# Patient Record
Sex: Male | Born: 1985 | Race: White | Hispanic: No | Marital: Single | Smoking: Former smoker
Health system: Southern US, Community
[De-identification: ages and names within clinical notes are randomized; demographics above are authoritative.]

## PROBLEM LIST (undated history)

## (undated) DIAGNOSIS — S2239XA Fracture of one rib, unspecified side, initial encounter for closed fracture: Secondary | ICD-10-CM

## (undated) DIAGNOSIS — F32A Depression, unspecified: Secondary | ICD-10-CM

## (undated) DIAGNOSIS — F4310 Post-traumatic stress disorder, unspecified: Secondary | ICD-10-CM

## (undated) DIAGNOSIS — F329 Major depressive disorder, single episode, unspecified: Secondary | ICD-10-CM

## (undated) DIAGNOSIS — J939 Pneumothorax, unspecified: Secondary | ICD-10-CM

## (undated) DIAGNOSIS — F419 Anxiety disorder, unspecified: Secondary | ICD-10-CM

## (undated) DIAGNOSIS — S36039A Unspecified laceration of spleen, initial encounter: Secondary | ICD-10-CM

## (undated) HISTORY — PX: OTHER SURGICAL HISTORY: SHX169

## (undated) HISTORY — DX: Unspecified laceration of spleen, initial encounter: S36.039A

## (undated) HISTORY — PX: ANTERIOR CRUCIATE LIGAMENT REPAIR: SHX115

## (undated) HISTORY — DX: Fracture of one rib, unspecified side, initial encounter for closed fracture: S22.39XA

---

## 2014-06-26 ENCOUNTER — Inpatient Hospital Stay (HOSPITAL_COMMUNITY)
Admission: EM | Admit: 2014-06-26 | Discharge: 2014-07-01 | DRG: 963 | Disposition: A | Discharge status: Home / Self Care | Payer: No Typology Code available for payment source | Attending: Surgery | Admitting: Surgery

## 2014-06-26 ENCOUNTER — Emergency Department (HOSPITAL_COMMUNITY): Payer: No Typology Code available for payment source

## 2014-06-26 ENCOUNTER — Encounter (HOSPITAL_COMMUNITY): Payer: Self-pay | Admitting: Radiology

## 2014-06-26 DIAGNOSIS — D62 Acute posthemorrhagic anemia: Secondary | ICD-10-CM | POA: Diagnosis not present

## 2014-06-26 DIAGNOSIS — F17200 Nicotine dependence, unspecified, uncomplicated: Secondary | ICD-10-CM | POA: Diagnosis present

## 2014-06-26 DIAGNOSIS — Z880 Allergy status to penicillin: Secondary | ICD-10-CM

## 2014-06-26 DIAGNOSIS — Y92410 Unspecified street and highway as the place of occurrence of the external cause: Secondary | ICD-10-CM | POA: Diagnosis not present

## 2014-06-26 DIAGNOSIS — S2249XA Multiple fractures of ribs, unspecified side, initial encounter for closed fracture: Secondary | ICD-10-CM | POA: Diagnosis present

## 2014-06-26 DIAGNOSIS — J9600 Acute respiratory failure, unspecified whether with hypoxia or hypercapnia: Secondary | ICD-10-CM | POA: Diagnosis present

## 2014-06-26 DIAGNOSIS — S36039A Unspecified laceration of spleen, initial encounter: Secondary | ICD-10-CM

## 2014-06-26 DIAGNOSIS — S0003XA Contusion of scalp, initial encounter: Secondary | ICD-10-CM | POA: Diagnosis present

## 2014-06-26 DIAGNOSIS — S27329A Contusion of lung, unspecified, initial encounter: Secondary | ICD-10-CM | POA: Diagnosis present

## 2014-06-26 DIAGNOSIS — S37819A Unspecified injury of adrenal gland, initial encounter: Secondary | ICD-10-CM | POA: Diagnosis present

## 2014-06-26 DIAGNOSIS — S060X9A Concussion with loss of consciousness of unspecified duration, initial encounter: Secondary | ICD-10-CM | POA: Diagnosis present

## 2014-06-26 DIAGNOSIS — S1093XA Contusion of unspecified part of neck, initial encounter: Secondary | ICD-10-CM | POA: Diagnosis present

## 2014-06-26 DIAGNOSIS — S270XXA Traumatic pneumothorax, initial encounter: Secondary | ICD-10-CM

## 2014-06-26 DIAGNOSIS — D735 Infarction of spleen: Secondary | ICD-10-CM | POA: Diagnosis present

## 2014-06-26 DIAGNOSIS — IMO0002 Reserved for concepts with insufficient information to code with codable children: Secondary | ICD-10-CM | POA: Diagnosis not present

## 2014-06-26 DIAGNOSIS — S025XXA Fracture of tooth (traumatic), initial encounter for closed fracture: Secondary | ICD-10-CM | POA: Diagnosis present

## 2014-06-26 DIAGNOSIS — T07XXXA Unspecified multiple injuries, initial encounter: Secondary | ICD-10-CM

## 2014-06-26 DIAGNOSIS — S032XXA Dislocation of tooth, initial encounter: Secondary | ICD-10-CM

## 2014-06-26 DIAGNOSIS — Z881 Allergy status to other antibiotic agents status: Secondary | ICD-10-CM | POA: Diagnosis not present

## 2014-06-26 DIAGNOSIS — J95821 Acute postprocedural respiratory failure: Secondary | ICD-10-CM

## 2014-06-26 DIAGNOSIS — S2241XA Multiple fractures of ribs, right side, initial encounter for closed fracture: Secondary | ICD-10-CM

## 2014-06-26 DIAGNOSIS — S060XAA Concussion with loss of consciousness status unknown, initial encounter: Secondary | ICD-10-CM

## 2014-06-26 DIAGNOSIS — S0083XA Contusion of other part of head, initial encounter: Secondary | ICD-10-CM | POA: Diagnosis present

## 2014-06-26 LAB — COMPREHENSIVE METABOLIC PANEL
ALT: 387 U/L — ABNORMAL HIGH (ref 0–53)
ANION GAP: 20 — AB (ref 5–15)
AST: 496 U/L — ABNORMAL HIGH (ref 0–37)
Albumin: 4.5 g/dL (ref 3.5–5.2)
Alkaline Phosphatase: 63 U/L (ref 39–117)
BUN: 15 mg/dL (ref 6–23)
CO2: 21 mEq/L (ref 19–32)
Calcium: 9.3 mg/dL (ref 8.4–10.5)
Chloride: 100 mEq/L (ref 96–112)
Creatinine, Ser: 1.18 mg/dL (ref 0.50–1.35)
GFR calc non Af Amer: 83 mL/min — ABNORMAL LOW (ref >90)
GLUCOSE: 172 mg/dL — AB (ref 70–99)
Potassium: 3.3 mEq/L — ABNORMAL LOW (ref 3.7–5.3)
SODIUM: 141 meq/L (ref 137–147)
TOTAL PROTEIN: 7.4 g/dL (ref 6.0–8.3)
Total Bilirubin: 0.7 mg/dL (ref 0.3–1.2)

## 2014-06-26 LAB — CBG MONITORING, ED: GLUCOSE-CAPILLARY: 172 mg/dL — AB (ref 70–99)

## 2014-06-26 LAB — I-STAT CHEM 8, ED
BUN: 17 mg/dL (ref 6–23)
CHLORIDE: 103 meq/L (ref 96–112)
Calcium, Ion: 1.17 mmol/L (ref 1.12–1.23)
Creatinine, Ser: 1.3 mg/dL (ref 0.50–1.35)
Glucose, Bld: 178 mg/dL — ABNORMAL HIGH (ref 70–99)
HEMATOCRIT: 50 % (ref 39.0–52.0)
HEMOGLOBIN: 17 g/dL (ref 13.0–17.0)
Potassium: 3.1 mEq/L — ABNORMAL LOW (ref 3.7–5.3)
SODIUM: 142 meq/L (ref 137–147)
TCO2: 22 mmol/L (ref 0–100)

## 2014-06-26 LAB — URINALYSIS, ROUTINE W REFLEX MICROSCOPIC
Bilirubin Urine: NEGATIVE
GLUCOSE, UA: NEGATIVE mg/dL
Ketones, ur: NEGATIVE mg/dL
Nitrite: NEGATIVE
PROTEIN: 100 mg/dL — AB
Specific Gravity, Urine: 1.026 (ref 1.005–1.030)
Urobilinogen, UA: 0.2 mg/dL (ref 0.0–1.0)
pH: 8 (ref 5.0–8.0)

## 2014-06-26 LAB — I-STAT ARTERIAL BLOOD GAS, ED
ACID-BASE DEFICIT: 2 mmol/L (ref 0.0–2.0)
Bicarbonate: 21.9 mEq/L (ref 20.0–24.0)
O2 Saturation: 100 %
TCO2: 23 mmol/L (ref 0–100)
pCO2 arterial: 35.3 mmHg (ref 35.0–45.0)
pH, Arterial: 7.4 (ref 7.350–7.450)
pO2, Arterial: 185 mmHg — ABNORMAL HIGH (ref 80.0–100.0)

## 2014-06-26 LAB — CBC
HCT: 46.7 % (ref 39.0–52.0)
Hemoglobin: 17 g/dL (ref 13.0–17.0)
MCH: 31.4 pg (ref 26.0–34.0)
MCHC: 36.4 g/dL — ABNORMAL HIGH (ref 30.0–36.0)
MCV: 86.2 fL (ref 78.0–100.0)
Platelets: 239 10*3/uL (ref 150–400)
RBC: 5.42 MIL/uL (ref 4.22–5.81)
RDW: 12.5 % (ref 11.5–15.5)
WBC: 22.7 10*3/uL — ABNORMAL HIGH (ref 4.0–10.5)

## 2014-06-26 LAB — URINE MICROSCOPIC-ADD ON

## 2014-06-26 LAB — PROTIME-INR
INR: 1.09 (ref 0.00–1.49)
PROTHROMBIN TIME: 14.1 s (ref 11.6–15.2)

## 2014-06-26 LAB — CDS SEROLOGY

## 2014-06-26 LAB — MRSA PCR SCREENING: MRSA by PCR: NEGATIVE

## 2014-06-26 LAB — ABO/RH: ABO/RH(D): B NEG

## 2014-06-26 LAB — ETHANOL

## 2014-06-26 MED ORDER — PROPOFOL 10 MG/ML IV EMUL
5.0000 ug/kg/min | INTRAVENOUS | Status: DC
  Administered 2014-06-26: 60 ug/kg/min via INTRAVENOUS
  Administered 2014-06-27: 50 ug/kg/min via INTRAVENOUS
  Administered 2014-06-27 (×2): 60 ug/kg/min via INTRAVENOUS
  Filled 2014-06-26 (×4): qty 100

## 2014-06-26 MED ORDER — CETYLPYRIDINIUM CHLORIDE 0.05 % MT LIQD
7.0000 mL | Freq: Four times a day (QID) | OROMUCOSAL | Status: DC
  Administered 2014-06-26 – 2014-06-27 (×3): 7 mL via OROMUCOSAL

## 2014-06-26 MED ORDER — MORPHINE SULFATE 2 MG/ML IJ SOLN
INTRAMUSCULAR | Status: AC
  Administered 2014-06-26: 4 mg via INTRAVENOUS
  Filled 2014-06-26: qty 2

## 2014-06-26 MED ORDER — LIDOCAINE HCL (CARDIAC) 20 MG/ML IV SOLN
INTRAVENOUS | Status: AC
Start: 2014-06-26 — End: 2014-06-27
  Filled 2014-06-26: qty 5

## 2014-06-26 MED ORDER — IOHEXOL 300 MG/ML  SOLN
100.0000 mL | Freq: Once | INTRAMUSCULAR | Status: AC | PRN
  Administered 2014-06-26: 100 mL via INTRAVENOUS

## 2014-06-26 MED ORDER — CHLORHEXIDINE GLUCONATE 0.12 % MT SOLN
15.0000 mL | Freq: Two times a day (BID) | OROMUCOSAL | Status: DC
  Administered 2014-06-27 (×3): 15 mL via OROMUCOSAL
  Filled 2014-06-26 (×3): qty 15

## 2014-06-26 MED ORDER — ONDANSETRON HCL 4 MG/2ML IJ SOLN
4.0000 mg | Freq: Four times a day (QID) | INTRAMUSCULAR | Status: DC | PRN
  Filled 2014-06-26: qty 2

## 2014-06-26 MED ORDER — BACITRACIN-NEOMYCIN-POLYMYXIN OINTMENT TUBE
TOPICAL_OINTMENT | Freq: Every day | CUTANEOUS | Status: DC
  Administered 2014-06-27: 11:00:00 via TOPICAL
  Filled 2014-06-26: qty 15

## 2014-06-26 MED ORDER — SODIUM CHLORIDE 0.9 % IV BOLUS (SEPSIS)
1000.0000 mL | Freq: Once | INTRAVENOUS | Status: AC
  Administered 2014-06-26: 1000 mL via INTRAVENOUS

## 2014-06-26 MED ORDER — ONDANSETRON HCL 4 MG PO TABS: 4.0000 mg | ORAL_TABLET | Freq: Four times a day (QID) | ORAL | Status: DC | PRN

## 2014-06-26 MED ORDER — PROPOFOL 10 MG/ML IV EMUL
INTRAVENOUS | Status: AC
  Administered 2014-06-26: 20:00:00
  Administered 2014-06-26 (×2): 50 ug/kg/min
  Administered 2014-06-26: 50 mg/h
  Administered 2014-06-26: 20:00:00
  Filled 2014-06-26: qty 100

## 2014-06-26 MED ORDER — HYDROMORPHONE HCL PF 1 MG/ML IJ SOLN
1.0000 mg | INTRAMUSCULAR | Status: DC | PRN
  Administered 2014-06-27 – 2014-06-28 (×7): 1 mg via INTRAVENOUS
  Filled 2014-06-26 (×7): qty 1

## 2014-06-26 MED ORDER — PANTOPRAZOLE SODIUM 40 MG IV SOLR
40.0000 mg | Freq: Every day | INTRAVENOUS | Status: DC
  Administered 2014-06-27: 40 mg via INTRAVENOUS
  Filled 2014-06-26 (×2): qty 40

## 2014-06-26 MED ORDER — PROPOFOL 10 MG/ML IV EMUL
5.0000 ug/kg/min | INTRAVENOUS | Status: DC
  Administered 2014-06-26 (×2): 70 ug/kg/min via INTRAVENOUS

## 2014-06-26 MED ORDER — PANTOPRAZOLE SODIUM 40 MG PO TBEC: 40.0000 mg | DELAYED_RELEASE_TABLET | Freq: Every day | ORAL | Status: DC

## 2014-06-26 MED ORDER — ROCURONIUM BROMIDE 50 MG/5ML IV SOLN
INTRAVENOUS | Status: AC
  Filled 2014-06-26: qty 2

## 2014-06-26 MED ORDER — ETOMIDATE 2 MG/ML IV SOLN
INTRAVENOUS | Status: AC
  Administered 2014-06-26: 120 mg
  Filled 2014-06-26: qty 20

## 2014-06-26 MED ORDER — DEXTROSE-NACL 5-0.9 % IV SOLN
INTRAVENOUS | Status: DC
  Administered 2014-06-26 – 2014-06-27 (×2): via INTRAVENOUS

## 2014-06-26 MED ORDER — SUCCINYLCHOLINE CHLORIDE 20 MG/ML IJ SOLN
INTRAMUSCULAR | Status: AC
  Administered 2014-06-26: 120 mg
  Filled 2014-06-26: qty 1

## 2014-06-26 MED ORDER — PROPOFOL 10 MG/ML IV EMUL
INTRAVENOUS | Status: AC
  Filled 2014-06-26: qty 100
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
## 2014-06-26 NOTE — ED Notes (Signed)
[facility] [id number]
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
## 2014-06-26 NOTE — ED Notes (Signed)
Family updated as to patient's status.

## 2014-06-27 ENCOUNTER — Inpatient Hospital Stay (HOSPITAL_COMMUNITY): Payer: No Typology Code available for payment source

## 2014-06-27 DIAGNOSIS — J9600 Acute respiratory failure, unspecified whether with hypoxia or hypercapnia: Secondary | ICD-10-CM | POA: Diagnosis present

## 2014-06-27 DIAGNOSIS — S270XXA Traumatic pneumothorax, initial encounter: Secondary | ICD-10-CM

## 2014-06-27 DIAGNOSIS — S2241XA Multiple fractures of ribs, right side, initial encounter for closed fracture: Secondary | ICD-10-CM

## 2014-06-27 DIAGNOSIS — S032XXA Dislocation of tooth, initial encounter: Secondary | ICD-10-CM

## 2014-06-27 DIAGNOSIS — S060X9A Concussion with loss of consciousness of unspecified duration, initial encounter: Secondary | ICD-10-CM

## 2014-06-27 DIAGNOSIS — S272XXA Traumatic hemopneumothorax, initial encounter: Secondary | ICD-10-CM

## 2014-06-27 DIAGNOSIS — S060XAA Concussion with loss of consciousness status unknown, initial encounter: Secondary | ICD-10-CM

## 2014-06-27 LAB — CBC
HEMATOCRIT: 40.7 % (ref 39.0–52.0)
HEMOGLOBIN: 14.5 g/dL (ref 13.0–17.0)
MCH: 31.1 pg (ref 26.0–34.0)
MCHC: 35.6 g/dL (ref 30.0–36.0)
MCV: 87.3 fL (ref 78.0–100.0)
Platelets: 167 10*3/uL (ref 150–400)
RBC: 4.66 MIL/uL (ref 4.22–5.81)
RDW: 12.7 % (ref 11.5–15.5)
WBC: 19.6 10*3/uL — AB (ref 4.0–10.5)

## 2014-06-27 LAB — TYPE AND SCREEN
ABO/RH(D): B NEG
Antibody Screen: NEGATIVE
Unit division: 0
Unit division: 0

## 2014-06-27 LAB — BLOOD GAS, ARTERIAL
Acid-base deficit: 1.1 mmol/L (ref 0.0–2.0)
Bicarbonate: 22.9 mEq/L (ref 20.0–24.0)
Drawn by: 249101
FIO2: 0.4 %
LHR: 16 {breaths}/min
O2 Saturation: 97.8 %
PATIENT TEMPERATURE: 98.6
PEEP: 5 cmH2O
PH ART: 7.41 (ref 7.350–7.450)
TCO2: 24 mmol/L (ref 0–100)
VT: 600 mL
pCO2 arterial: 36.9 mmHg (ref 35.0–45.0)
pO2, Arterial: 90.7 mmHg (ref 80.0–100.0)

## 2014-06-27 LAB — COMPREHENSIVE METABOLIC PANEL
ALBUMIN: 3.5 g/dL (ref 3.5–5.2)
ALK PHOS: 47 U/L (ref 39–117)
ALT: 295 U/L — AB (ref 0–53)
AST: 410 U/L — ABNORMAL HIGH (ref 0–37)
Anion gap: 13 (ref 5–15)
BILIRUBIN TOTAL: 0.8 mg/dL (ref 0.3–1.2)
BUN: 15 mg/dL (ref 6–23)
CHLORIDE: 107 meq/L (ref 96–112)
CO2: 21 mEq/L (ref 19–32)
Calcium: 8.2 mg/dL — ABNORMAL LOW (ref 8.4–10.5)
Creatinine, Ser: 0.99 mg/dL (ref 0.50–1.35)
GFR calc Af Amer: >90 mL/min (ref >90)
GFR calc non Af Amer: >90 mL/min (ref >90)
Glucose, Bld: 140 mg/dL — ABNORMAL HIGH (ref 70–99)
POTASSIUM: 3.1 meq/L — AB (ref 3.7–5.3)
SODIUM: 141 meq/L (ref 137–147)
TOTAL PROTEIN: 5.7 g/dL — AB (ref 6.0–8.3)

## 2014-06-27 LAB — PREPARE FRESH FROZEN PLASMA
UNIT DIVISION: 0
Unit division: 0

## 2014-06-27 LAB — HEMOGLOBIN AND HEMATOCRIT, BLOOD
HCT: 39.6 % (ref 39.0–52.0)
Hemoglobin: 14 g/dL (ref 13.0–17.0)

## 2014-06-27 MED ORDER — LIDOCAINE HCL (PF) 1 % IJ SOLN
INTRAMUSCULAR | Status: AC
  Filled 2014-06-27: qty 5

## 2014-06-27 MED ORDER — SODIUM CHLORIDE 0.9 % IV SOLN
INTRAVENOUS | Status: DC
  Administered 2014-06-27: 10:00:00 via INTRAVENOUS

## 2014-06-27 MED ORDER — MIDAZOLAM HCL 2 MG/2ML IJ SOLN: 5.0000 mg | Freq: Once | INTRAMUSCULAR | Status: DC

## 2014-06-27 MED ORDER — FENTANYL CITRATE 0.05 MG/ML IJ SOLN
100.0000 ug | Freq: Once | INTRAMUSCULAR | Status: DC
  Filled 2014-06-27: qty 2

## 2014-06-27 MED ORDER — POTASSIUM CHLORIDE 20 MEQ/15ML (10%) PO LIQD
30.0000 meq | ORAL | Status: AC
  Administered 2014-06-27 (×2): 30 meq
  Filled 2014-06-27 (×3): qty 30

## 2014-06-27 MED ORDER — FENTANYL CITRATE 0.05 MG/ML IJ SOLN
INTRAMUSCULAR | Status: AC
  Administered 2014-06-27: 100 ug
  Filled 2014-06-27: qty 4

## 2014-06-27 MED ORDER — MIDAZOLAM HCL 2 MG/2ML IJ SOLN
INTRAMUSCULAR | Status: AC
  Administered 2014-06-27: 5 mg
  Filled 2014-06-27: qty 6

## 2014-06-27 MED ORDER — LIDOCAINE HCL (PF) 1 % IJ SOLN
INTRAMUSCULAR | Status: AC
  Administered 2014-06-27: 11:00:00
  Filled 2014-06-27: qty 5

## 2014-06-27 NOTE — Progress Notes (Signed)
Paged Dr. Wilson regardingAndrey Campanile cervical xray results.  Dr. Andrey CampanileWilson cleared patient to remove C-collar. Shadd Dunstan C

## 2014-06-27 NOTE — Procedures (Signed)
Chest Tube Insertion Procedure Note  Indications:  Clinically significant Right Pneumothorax  Pre-operative Diagnosis: Right Pneumothorax  Post-operative Diagnosis: Right Pneumothorax  Procedure Details  Informed consent was obtained for the procedure, including sedation.  Risks of lung perforation, hemorrhage, arrhythmia, and adverse drug reaction were discussed.   He was sedated with propofol, versed, and fentanyl.  After sterile skin prep, using standard technique, a 28 French tube was placed in the right anterior lateral 5th-6th rib space.  Findings:  Rush of air released, pneumothorax confirmed  Estimated Blood Loss:  Minimal         Specimens:  None              Complications:  None; patient tolerated the procedure well.         Disposition: ICU - intubated and hemodynamically stable.         Condition: stable

## 2014-06-27 NOTE — Progress Notes (Signed)
Patient was combative and bit down on tube dislodging upper left central incisor. Paul Porter C
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
## 2014-06-27 NOTE — Progress Notes (Signed)
06/27/2014- Resp care note- Pt suctioned and extubated to 4lpm cannula at 1510.  RN and physician assistant at bedside for procedure.  Pt agitated prior to tube removal.  Once tube removed, pt relaxed and breathing more comfortably.  Vital signs post extubation- hr104, rr26, bp 129/86, sats 97% on 4lpm cannula.  Pt vocalizing post extubation.  Ventilator and ambu-bag at bedside.  Will monitor progress and wean as tolerated.  s Deepak Bless rrt, rcp
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
## 2014-06-28 ENCOUNTER — Inpatient Hospital Stay (HOSPITAL_COMMUNITY): Payer: No Typology Code available for payment source

## 2014-06-28 DIAGNOSIS — D62 Acute posthemorrhagic anemia: Secondary | ICD-10-CM

## 2014-06-28 LAB — CBC
HCT: 34.3 % — ABNORMAL LOW (ref 39.0–52.0)
Hemoglobin: 12.3 g/dL — ABNORMAL LOW (ref 13.0–17.0)
MCH: 30.8 pg (ref 26.0–34.0)
MCHC: 35.9 g/dL (ref 30.0–36.0)
MCV: 86 fL (ref 78.0–100.0)
PLATELETS: 86 10*3/uL — AB (ref 150–400)
RBC: 3.99 MIL/uL — AB (ref 4.22–5.81)
RDW: 12.4 % (ref 11.5–15.5)
WBC: 11.6 10*3/uL — AB (ref 4.0–10.5)

## 2014-06-28 LAB — CBC WITH DIFFERENTIAL/PLATELET
BASOS PCT: 0 % (ref 0–1)
Basophils Absolute: 0 10*3/uL (ref 0.0–0.1)
EOS PCT: 1 % (ref 0–5)
Eosinophils Absolute: 0.1 10*3/uL (ref 0.0–0.7)
HCT: 32.2 % — ABNORMAL LOW (ref 39.0–52.0)
Hemoglobin: 11.6 g/dL — ABNORMAL LOW (ref 13.0–17.0)
LYMPHS PCT: 18 % (ref 12–46)
Lymphs Abs: 2.2 10*3/uL (ref 0.7–4.0)
MCH: 31.2 pg (ref 26.0–34.0)
MCHC: 36 g/dL (ref 30.0–36.0)
MCV: 86.6 fL (ref 78.0–100.0)
Monocytes Absolute: 1.1 10*3/uL — ABNORMAL HIGH (ref 0.1–1.0)
Monocytes Relative: 9 % (ref 3–12)
NEUTROS ABS: 9.2 10*3/uL — AB (ref 1.7–7.7)
Neutrophils Relative %: 73 % (ref 43–77)
PLATELETS: 101 10*3/uL — AB (ref 150–400)
RBC: 3.72 MIL/uL — ABNORMAL LOW (ref 4.22–5.81)
RDW: 12.6 % (ref 11.5–15.5)
WBC: 12.6 10*3/uL — AB (ref 4.0–10.5)

## 2014-06-28 LAB — BASIC METABOLIC PANEL
ANION GAP: 10 (ref 5–15)
BUN: 8 mg/dL (ref 6–23)
CALCIUM: 7.7 mg/dL — AB (ref 8.4–10.5)
CO2: 24 mEq/L (ref 19–32)
CREATININE: 0.81 mg/dL (ref 0.50–1.35)
Chloride: 103 mEq/L (ref 96–112)
GFR calc Af Amer: >90 mL/min (ref >90)
Glucose, Bld: 100 mg/dL — ABNORMAL HIGH (ref 70–99)
Potassium: 3.5 mEq/L — ABNORMAL LOW (ref 3.7–5.3)
SODIUM: 137 meq/L (ref 137–147)

## 2014-06-28 LAB — TRIGLYCERIDES: Triglycerides: 172 mg/dL — ABNORMAL HIGH

## 2014-06-28 MED ORDER — BACITRACIN-NEOMYCIN-POLYMYXIN OINTMENT TUBE
TOPICAL_OINTMENT | Freq: Two times a day (BID) | CUTANEOUS | Status: DC
  Administered 2014-06-28: 3 via TOPICAL
  Administered 2014-06-28 – 2014-07-01 (×6): via TOPICAL
  Filled 2014-06-28: qty 15

## 2014-06-28 MED ORDER — DOCUSATE SODIUM 100 MG PO CAPS
100.0000 mg | ORAL_CAPSULE | Freq: Two times a day (BID) | ORAL | Status: DC
  Administered 2014-06-28 – 2014-07-01 (×7): 100 mg via ORAL
  Filled 2014-06-28 (×8): qty 1

## 2014-06-28 MED ORDER — POLYETHYLENE GLYCOL 3350 17 G PO PACK
17.0000 g | PACK | Freq: Every day | ORAL | Status: DC
  Administered 2014-07-01 (×2): 17 g via ORAL
  Filled 2014-06-28 (×5): qty 1

## 2014-06-28 MED ORDER — HYDROMORPHONE HCL PF 1 MG/ML IJ SOLN
0.5000 mg | INTRAMUSCULAR | Status: DC | PRN
  Administered 2014-06-30 – 2014-07-01 (×3): 0.5 mg via INTRAVENOUS
  Filled 2014-06-28 (×4): qty 1

## 2014-06-28 MED ORDER — OXYCODONE HCL 5 MG PO TABS
5.0000 mg | ORAL_TABLET | ORAL | Status: DC | PRN
  Administered 2014-06-28 (×2): 10 mg via ORAL
  Administered 2014-06-29 (×5): 5 mg via ORAL
  Administered 2014-06-30: 10 mg via ORAL
  Administered 2014-06-30: 15 mg via ORAL
  Administered 2014-06-30: 5 mg via ORAL
  Administered 2014-06-30 – 2014-07-01 (×2): 10 mg via ORAL
  Administered 2014-07-01: 15 mg via ORAL
  Filled 2014-06-28 (×2): qty 2
  Filled 2014-06-28: qty 3
  Filled 2014-06-28: qty 2
  Filled 2014-06-28: qty 3
  Filled 2014-06-28 (×2): qty 1
  Filled 2014-06-28: qty 2
  Filled 2014-06-28 (×4): qty 1
  Filled 2014-06-28: qty 3

## 2014-06-28 MED ORDER — DIPHENHYDRAMINE HCL 25 MG PO CAPS
25.0000 mg | ORAL_CAPSULE | Freq: Four times a day (QID) | ORAL | Status: DC | PRN
  Administered 2014-06-28 – 2014-07-01 (×11): 25 mg via ORAL
  Filled 2014-06-28 (×11): qty 1

## 2014-06-28 NOTE — Procedures (Signed)
I was present for the entire procedure and the patient did well without complications.  This patient has been seen and I agree with the findings and treatment plan.  Marta LamasJames O. Gae BonWyatt, III, MD, FACS [phone] (pager) [phone] (direct pager) Trauma Surgeon
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
## 2014-06-28 NOTE — Plan of Care (Signed)
Problem: SLP Cognition Goals Goal: Patient will demonstrate attention to functional Patient will demonstrate attention to functional task with Selective attention

## 2014-06-29 ENCOUNTER — Inpatient Hospital Stay (HOSPITAL_COMMUNITY): Payer: No Typology Code available for payment source

## 2014-06-29 LAB — CBC
HEMATOCRIT: 34.6 % — AB (ref 39.0–52.0)
HEMOGLOBIN: 12.3 g/dL — AB (ref 13.0–17.0)
MCH: 30.2 pg (ref 26.0–34.0)
MCHC: 35.5 g/dL (ref 30.0–36.0)
MCV: 85 fL (ref 78.0–100.0)
Platelets: 90 10*3/uL — ABNORMAL LOW (ref 150–400)
RBC: 4.07 MIL/uL — ABNORMAL LOW (ref 4.22–5.81)
RDW: 12.4 % (ref 11.5–15.5)
WBC: 12.2 10*3/uL — AB (ref 4.0–10.5)

## 2014-06-29 LAB — BLOOD PRODUCT ORDER (VERBAL) VERIFICATION
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
## 2014-06-29 NOTE — Progress Notes (Signed)
Patient is doing well.  Hemoglobin is stable, platelets are increasing.  Keep at bedrest. But can be transferred to the floor.  This patient has been seen and I agree with the findings and treatment plan.  Marta LamasJames O. Gae BonWyatt, III, MD, FACS [phone] (pager) [phone] (direct pager) Trauma Surgeon

## 2014-06-30 ENCOUNTER — Inpatient Hospital Stay (HOSPITAL_COMMUNITY): Payer: No Typology Code available for payment source

## 2014-06-30 LAB — CBC
HEMATOCRIT: 38.6 % — AB (ref 39.0–52.0)
HEMOGLOBIN: 13.7 g/dL (ref 13.0–17.0)
MCH: 30.5 pg (ref 26.0–34.0)
MCHC: 35.5 g/dL (ref 30.0–36.0)
MCV: 86 fL (ref 78.0–100.0)
PLATELETS: 101 10*3/uL — AB (ref 150–400)
RBC: 4.49 MIL/uL (ref 4.22–5.81)
RDW: 12.4 % (ref 11.5–15.5)
WBC: 10.1 10*3/uL (ref 4.0–10.5)

## 2014-06-30 NOTE — Progress Notes (Signed)
SLP Cancellation Note  Patient Details Name: Paul Porter MRN: [id number] DOB: 02/19/1986   Cancelled treatment:       Reason Eval/Treat Not Completed: Patient at procedure or test/unavailable (pt not in room upon SLP arrival). Will return as able for cognitive tx.    Paul Porter, M.A. CCC-SLP [phone]  Paul Hamaiewonsky, Paul Porter 06/30/2014, 2:56 PM

## 2014-06-30 NOTE — Progress Notes (Signed)
UR completed.  Linell Meldrum, RN BSN MHA CCM Trauma/Neuro ICU Case Manager [phone]  

## 2014-06-30 NOTE — Progress Notes (Signed)
Doing well and should be able to go home tomorrow.  This patient has been seen and I agree with the findings and treatment plan.  Kentavius Dettore O. Giulian Goldring, III, MD, FACS [phone] (pager) [phone] (direct pager) Trauma Surgeon 
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
## 2014-06-30 NOTE — Progress Notes (Signed)
Patient ID: Paul Porter, male   DOB: 08/12/1986, 28 y.o.   MRN: [id number]   LOS: 4 days   Subjective: No new c/o.   Objective: Vital signs in last 24 hours: Temp:  [97.8 F (36.6 C)-99.5 F (37.5 C)] 97.8 F (36.6 C) (08/14 0510) Pulse Rate:  [74-85] 83 (08/14 0510) Resp:  [18-19] 19 (08/14 0510) BP: (122-138)/(70-90) 138/84 mmHg (08/14 0510) SpO2:  [96 %-99 %] 98 % (08/14 0510) Last BM Date: 06/26/14   Laboratory  CBC  Recent Labs  06/29/14 0404 06/30/14 0504  WBC 12.2* 10.1  HGB 12.3* 13.7  HCT 34.6* 38.6*  PLT 90* 101*    Radiology Results PORTABLE CHEST - 1 VIEW  COMPARISON: 06/29/2014  FINDINGS:  Previously seen right chest tube has been removed. No pneumothorax  visualized. The heart size and mediastinal contours are within  normal limits. Mild atelectasis or scarring again seen in the left  lung base. Both lungs are otherwise clear. The visualized skeletal  structures are unremarkable.  IMPRESSION:  No active disease. No pneumothorax identified status post right  chest tube removal.  Electronically Signed  By: Myles RosenthalJohn Stahl M.D.  On: 06/30/2014 07:39   Physical Exam General appearance: alert and no distress Resp: clear to auscultation bilaterally Cardio: regular rate and rhythm GI: normal findings: bowel sounds normal and soft, non-tender   Assessment/Plan: [facility]  Concussion -- ST eval  Tooth avulsion  Multiple right rib fxs w/PTX s/p CT  Grade 3 splenic lac -- Ambulate today ABL anemia -- Stabilized  FEN -- No issues VTE -- SCD's  Dispo -- PT/OT consults, maybe home tomorrow if hgb stable and PT clears.    Freeman CaldronMichael J. Latoia Eyster, PA-C Pager: [phone] General Trauma PA Pager: [phone]  06/30/2014

## 2014-07-01 LAB — CBC
HEMATOCRIT: 36.5 % — AB (ref 39.0–52.0)
HEMOGLOBIN: 12.9 g/dL — AB (ref 13.0–17.0)
MCH: 29.9 pg (ref 26.0–34.0)
MCHC: 35.3 g/dL (ref 30.0–36.0)
MCV: 84.5 fL (ref 78.0–100.0)
Platelets: 127 10*3/uL — ABNORMAL LOW (ref 150–400)
RBC: 4.32 MIL/uL (ref 4.22–5.81)
RDW: 12.4 % (ref 11.5–15.5)
WBC: 10.2 10*3/uL (ref 4.0–10.5)

## 2014-07-01 MED ORDER — OXYCODONE HCL 5 MG PO TABS: 5.0000 mg | ORAL_TABLET | Freq: Four times a day (QID) | ORAL | Status: DC | PRN

## 2014-07-01 NOTE — Discharge Planning (Signed)
Copy of AVS and rx to pt who verbalizes understanding.  MD discussed wound care with pt.  Pt showered and dressings reapplied. Dc'd per w/c to private car home with all personal belongings, acomp. By family.
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
## 2014-07-01 NOTE — Discharge Instructions (Signed)
No running, jumping, ball or contact sports, bikes, skateboards, motorcycles for 3 months.  No driving while taking oxycodone or hydrocodone.  Wash wounds daily in shower with soap and water. Do not soak. Apply antibiotic ointment (e.g. Neosporin) twice daily and as needed to keep moist. Cover with dry dressing.  [facility] - DISCHARGE INSTRUCTIONS TO PATIENT  Activity:  Driving - No driving while taking oxycodone.   Lifting - Take it easy for 7 days, but then no limit  Wound Care:   No running, jumping, ball or contact sports, bikes, skateboards, motorcycles for 3 months.      Wash wounds daily in shower with soap and water.      Do not soak.      Apply antibiotic ointment (e.g. Neosporin) twice daily and as needed to keep moist.  Diet:  Eat light until normal bowel function returns.  Follow up appointment:  Call [facility] at [phone] for an appointment in 1 to 2 weeks in the Trauma Clinic  Medications and dosages:  Resume your home medications.  You have a prescription for:  Oxycodone.             I would avoid NSAID's (i.e., aspirin, advil) until seen in the trauma clinic  Call Dr. Lindie SpruceWyatt (Trauma Surgeon) or his office  [phone]) if you have:  Temperature greater than 100.4,  Persistent nausea and vomiting,  Severe uncontrolled pain,  Redness, tenderness, or signs of infection (pain, swelling, redness, odor or green/yellow discharge around the site),  Difficulty breathing, headache or visual disturbances,  Any other questions or concerns you may have after discharge.  In an emergency, call 911 or go to an Emergency Department at a nearby hospital.
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
## 2014-07-03 ENCOUNTER — Telehealth (HOSPITAL_COMMUNITY): Payer: Self-pay
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
## 2014-07-04 NOTE — Telephone Encounter (Signed)
Scheduled appointment for pt on 8/26 for suture removal.

## 2014-07-12 ENCOUNTER — Encounter (INDEPENDENT_AMBULATORY_CARE_PROVIDER_SITE_OTHER): Payer: Self-pay

## 2014-07-12 ENCOUNTER — Encounter (INDEPENDENT_AMBULATORY_CARE_PROVIDER_SITE_OTHER): Payer: Self-pay | Admitting: *Deleted

## 2014-07-12 ENCOUNTER — Ambulatory Visit (INDEPENDENT_AMBULATORY_CARE_PROVIDER_SITE_OTHER): Payer: Self-pay | Admitting: General Surgery

## 2014-07-12 DIAGNOSIS — S069XAS Unspecified intracranial injury with loss of consciousness status unknown, sequela: Secondary | ICD-10-CM

## 2014-07-12 DIAGNOSIS — S025XXA Fracture of tooth (traumatic), initial encounter for closed fracture: Secondary | ICD-10-CM

## 2014-07-12 DIAGNOSIS — M25519 Pain in unspecified shoulder: Secondary | ICD-10-CM

## 2014-07-12 DIAGNOSIS — Z5189 Encounter for other specified aftercare: Secondary | ICD-10-CM

## 2014-07-12 DIAGNOSIS — S069X9S Unspecified intracranial injury with loss of consciousness of unspecified duration, sequela: Secondary | ICD-10-CM

## 2014-07-12 DIAGNOSIS — IMO0001 Reserved for inherently not codable concepts without codable children: Secondary | ICD-10-CM

## 2014-07-12 DIAGNOSIS — S36039D Unspecified laceration of spleen, subsequent encounter: Secondary | ICD-10-CM

## 2014-07-12 DIAGNOSIS — S37812D Contusion of adrenal gland, subsequent encounter: Secondary | ICD-10-CM

## 2014-07-12 DIAGNOSIS — S3609XA Other injury of spleen, initial encounter: Secondary | ICD-10-CM

## 2014-07-12 DIAGNOSIS — S060X9S Concussion with loss of consciousness of unspecified duration, sequela: Secondary | ICD-10-CM

## 2014-07-12 DIAGNOSIS — S2241XD Multiple fractures of ribs, right side, subsequent encounter for fracture with routine healing: Secondary | ICD-10-CM

## 2014-07-12 DIAGNOSIS — M25511 Pain in right shoulder: Secondary | ICD-10-CM

## 2014-07-12 DIAGNOSIS — S032XXD Dislocation of tooth, subsequent encounter: Secondary | ICD-10-CM

## 2014-07-12 MED ORDER — IBUPROFEN 200 MG PO TABS: 800.0000 mg | ORAL_TABLET | Freq: Three times a day (TID) | ORAL | Status: AC | PRN

## 2014-07-12 MED ORDER — METHOCARBAMOL 750 MG PO TABS: 750.0000 mg | ORAL_TABLET | Freq: Three times a day (TID) | ORAL | Status: DC | PRN

## 2014-07-12 MED ORDER — OXYCODONE HCL 5 MG PO TABS: 5.0000 mg | ORAL_TABLET | Freq: Three times a day (TID) | ORAL | Status: DC | PRN

## 2014-07-12 NOTE — Patient Instructions (Addendum)
1.  Start Motrin  as needed for pain/inflammation up to 3 times a day.  Take with food/snack. 2.  Start robaxin (muscle relaxer) 3.  Continue oxycodone, but start to wean down off of them.  Use them sparingly for severe pain. 4.  Start gentle range of motion and stretching exercises of your upper and lower extremities.  Gradual return to activity.  Ribs will take 6-8 weeks to fully heal. 5.  Do not smoke marijuana with your pain prescriptions 6.  Ice areas of pain 7.  Use scar cream, vit E, or vaseline to help minimize road rash scarring 8.  Can return to work on light duty/desk work on 07/17/14 or no restrictions in 2-4 weeks
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
## 2014-08-09 ENCOUNTER — Encounter (INDEPENDENT_AMBULATORY_CARE_PROVIDER_SITE_OTHER): Payer: Self-pay

## 2014-08-10 ENCOUNTER — Other Ambulatory Visit (INDEPENDENT_AMBULATORY_CARE_PROVIDER_SITE_OTHER): Payer: Self-pay | Admitting: *Deleted

## 2014-08-10 DIAGNOSIS — K5732 Diverticulitis of large intestine without perforation or abscess without bleeding: Secondary | ICD-10-CM

## 2014-09-01 ENCOUNTER — Other Ambulatory Visit: Payer: Self-pay | Admitting: Family Medicine

## 2014-09-01 DIAGNOSIS — M25512 Pain in left shoulder: Secondary | ICD-10-CM

## 2014-09-05 ENCOUNTER — Other Ambulatory Visit: Payer: Self-pay | Admitting: Family Medicine

## 2014-09-05 DIAGNOSIS — Z139 Encounter for screening, unspecified: Secondary | ICD-10-CM

## 2014-09-08 ENCOUNTER — Ambulatory Visit (INDEPENDENT_AMBULATORY_CARE_PROVIDER_SITE_OTHER): Payer: Self-pay | Admitting: Neurology

## 2014-09-08 ENCOUNTER — Encounter: Payer: Self-pay | Admitting: Neurology

## 2014-09-08 VITALS — BP 112/86 | HR 68 | Resp 20 | Ht 67.25 in | Wt 180.0 lb

## 2014-09-08 DIAGNOSIS — F329 Major depressive disorder, single episode, unspecified: Secondary | ICD-10-CM

## 2014-09-08 DIAGNOSIS — F0781 Postconcussional syndrome: Secondary | ICD-10-CM

## 2014-09-08 DIAGNOSIS — R413 Other amnesia: Secondary | ICD-10-CM

## 2014-09-08 DIAGNOSIS — F32A Depression, unspecified: Secondary | ICD-10-CM

## 2014-09-08 MED ORDER — ESCITALOPRAM OXALATE 5 MG PO TABS: 5.0000 mg | ORAL_TABLET | Freq: Every day | ORAL | Status: DC

## 2014-09-08 NOTE — Patient Instructions (Addendum)
1. Schedule MRI brain without contrast 2. Start Lexapro 5mg  once a day 3. Follow-up in 2 months
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
## 2014-09-09 ENCOUNTER — Encounter: Payer: Self-pay | Admitting: Neurology

## 2014-09-15 ENCOUNTER — Ambulatory Visit
Admission: RE | Admit: 2014-09-15 | Discharge: 2014-09-15 | Disposition: A | Discharge status: Home / Self Care | Payer: No Typology Code available for payment source | Source: Ambulatory Visit | Attending: Family Medicine | Admitting: Family Medicine

## 2014-09-15 ENCOUNTER — Other Ambulatory Visit: Payer: Self-pay

## 2014-09-15 ENCOUNTER — Ambulatory Visit
Admission: RE | Admit: 2014-09-15 | Discharge: 2014-09-15 | Disposition: A | Discharge status: Home / Self Care | Payer: No Typology Code available for payment source | Source: Ambulatory Visit | Attending: Neurology | Admitting: Neurology

## 2014-09-15 DIAGNOSIS — M25512 Pain in left shoulder: Secondary | ICD-10-CM

## 2014-09-15 DIAGNOSIS — Z139 Encounter for screening, unspecified: Secondary | ICD-10-CM

## 2014-09-15 MED ORDER — IOHEXOL 180 MG/ML  SOLN
15.0000 mL | Freq: Once | INTRAMUSCULAR | Status: AC | PRN
  Administered 2014-09-15: 15 mL via INTRA_ARTICULAR

## 2014-09-18 ENCOUNTER — Other Ambulatory Visit: Payer: Self-pay | Admitting: Family Medicine

## 2014-09-18 ENCOUNTER — Telehealth: Payer: Self-pay | Admitting: Neurology

## 2014-09-18 DIAGNOSIS — M25561 Pain in right knee: Secondary | ICD-10-CM

## 2014-09-18 NOTE — Telephone Encounter (Signed)
Pt would like the results of the MRI [phone]

## 2014-09-18 NOTE — Telephone Encounter (Signed)
Pls see Results Note, thanks!

## 2014-09-18 NOTE — Telephone Encounter (Signed)
Have you had a chance to review his results? Please advise.

## 2014-09-20 ENCOUNTER — Ambulatory Visit
Admission: RE | Admit: 2014-09-20 | Discharge: 2014-09-20 | Disposition: A | Discharge status: Home / Self Care | Payer: Self-pay | Source: Ambulatory Visit | Attending: Family Medicine | Admitting: Family Medicine

## 2014-09-20 DIAGNOSIS — M25561 Pain in right knee: Secondary | ICD-10-CM

## 2014-11-08 ENCOUNTER — Telehealth: Payer: Self-pay | Admitting: Neurology

## 2014-11-08 ENCOUNTER — Ambulatory Visit: Payer: Self-pay | Admitting: Neurology

## 2014-11-08 NOTE — Telephone Encounter (Signed)
See previous documentation. Appt marked as a no show due to same day cancellation but a no show letter will not be sent. Pt has already rescheduled appt in January / Sherri S.

## 2014-11-08 NOTE — Telephone Encounter (Signed)
Pt called at 8:21AM to cancel his f/u appt for today 11/08/14.  Pt stated that he can not drive due to him having surgery on his leg a few days ago. I spoke w/ pt yesterday and confirmed with me. (I am not sure if he doesn't remember due to him being on meds.)

## 2014-11-08 NOTE — Telephone Encounter (Signed)
Noted  

## 2014-11-27 ENCOUNTER — Ambulatory Visit (INDEPENDENT_AMBULATORY_CARE_PROVIDER_SITE_OTHER): Payer: Self-pay | Admitting: Neurology

## 2014-11-27 ENCOUNTER — Encounter: Payer: Self-pay | Admitting: Neurology

## 2014-11-27 VITALS — BP 120/90 | HR 92 | Resp 18 | Ht 67.25 in

## 2014-11-27 DIAGNOSIS — F32A Depression, unspecified: Secondary | ICD-10-CM

## 2014-11-27 DIAGNOSIS — F329 Major depressive disorder, single episode, unspecified: Secondary | ICD-10-CM

## 2014-11-27 DIAGNOSIS — F0781 Postconcussional syndrome: Secondary | ICD-10-CM

## 2014-11-27 DIAGNOSIS — R413 Other amnesia: Secondary | ICD-10-CM
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
## 2014-11-27 NOTE — Patient Instructions (Signed)
1. Proceed with psychotherapy as scheduled 2. Continue with Lexapro 3. Call our office for any changes in your symptoms

## 2014-11-30 ENCOUNTER — Encounter: Payer: Self-pay | Admitting: Neurology

## 2018-01-03 ENCOUNTER — Emergency Department (HOSPITAL_COMMUNITY)
Admission: EM | Admit: 2018-01-03 | Discharge: 2018-01-03 | Disposition: A | Discharge status: Home / Self Care | Payer: Non-veteran care | Attending: Emergency Medicine | Admitting: Emergency Medicine

## 2018-01-03 ENCOUNTER — Other Ambulatory Visit: Payer: Self-pay

## 2018-01-03 ENCOUNTER — Encounter (HOSPITAL_COMMUNITY): Payer: Self-pay

## 2018-01-03 DIAGNOSIS — Y9389 Activity, other specified: Secondary | ICD-10-CM | POA: Diagnosis not present

## 2018-01-03 DIAGNOSIS — X58XXXA Exposure to other specified factors, initial encounter: Secondary | ICD-10-CM | POA: Diagnosis not present

## 2018-01-03 DIAGNOSIS — T1592XA Foreign body on external eye, part unspecified, left eye, initial encounter: Secondary | ICD-10-CM

## 2018-01-03 DIAGNOSIS — Y998 Other external cause status: Secondary | ICD-10-CM | POA: Diagnosis not present

## 2018-01-03 DIAGNOSIS — Y929 Unspecified place or not applicable: Secondary | ICD-10-CM | POA: Diagnosis not present

## 2018-01-03 DIAGNOSIS — T1502XA Foreign body in cornea, left eye, initial encounter: Secondary | ICD-10-CM | POA: Diagnosis present

## 2018-01-03 DIAGNOSIS — Z79899 Other long term (current) drug therapy: Secondary | ICD-10-CM | POA: Insufficient documentation

## 2018-01-03 DIAGNOSIS — F17200 Nicotine dependence, unspecified, uncomplicated: Secondary | ICD-10-CM | POA: Diagnosis not present

## 2018-01-03 DIAGNOSIS — Z23 Encounter for immunization: Secondary | ICD-10-CM | POA: Insufficient documentation

## 2018-01-03 MED ORDER — TETRACAINE HCL 0.5 % OP SOLN
2.0000 [drp] | Freq: Once | OPHTHALMIC | Status: AC
  Administered 2018-01-03: 2 [drp] via OPHTHALMIC
  Filled 2018-01-03: qty 4

## 2018-01-03 MED ORDER — POLYMYXIN B-TRIMETHOPRIM 10000-0.1 UNIT/ML-% OP SOLN: 2.0000 [drp] | OPHTHALMIC | 0 refills | Status: AC

## 2018-01-03 MED ORDER — FLUORESCEIN SODIUM 1 MG OP STRP
1.0000 | ORAL_STRIP | Freq: Once | OPHTHALMIC | Status: AC
  Administered 2018-01-03: 1 via OPHTHALMIC
  Filled 2018-01-03: qty 1

## 2018-01-03 MED ORDER — TETANUS-DIPHTH-ACELL PERTUSSIS 5-2.5-18.5 LF-MCG/0.5 IM SUSP
0.5000 mL | Freq: Once | INTRAMUSCULAR | Status: AC
  Administered 2018-01-03: 0.5 mL via INTRAMUSCULAR
  Filled 2018-01-03: qty 0.5
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
## 2018-01-03 NOTE — ED Notes (Signed)
Bed: WTR9 Expected date:  Expected time:  Means of arrival:  Comments: 

## 2018-01-03 NOTE — Discharge Instructions (Signed)
Use the antibiotic, as prescribed, for 7 days. Go directly to the ophthalmologist office following discharge from the ED.

## 2018-05-24 ENCOUNTER — Encounter (HOSPITAL_COMMUNITY): Payer: Self-pay

## 2018-05-24 ENCOUNTER — Emergency Department (HOSPITAL_COMMUNITY)
Admission: EM | Admit: 2018-05-24 | Discharge: 2018-05-24 | Disposition: A | Discharge status: Home / Self Care | Payer: Non-veteran care | Attending: Emergency Medicine | Admitting: Emergency Medicine

## 2018-05-24 ENCOUNTER — Other Ambulatory Visit: Payer: Self-pay

## 2018-05-24 DIAGNOSIS — Z87891 Personal history of nicotine dependence: Secondary | ICD-10-CM | POA: Insufficient documentation

## 2018-05-24 DIAGNOSIS — K2900 Acute gastritis without bleeding: Secondary | ICD-10-CM | POA: Insufficient documentation

## 2018-05-24 DIAGNOSIS — R112 Nausea with vomiting, unspecified: Secondary | ICD-10-CM

## 2018-05-24 HISTORY — DX: Post-traumatic stress disorder, unspecified: F43.10

## 2018-05-24 HISTORY — DX: Pneumothorax, unspecified: J93.9

## 2018-05-24 HISTORY — DX: Depression, unspecified: F32.A

## 2018-05-24 HISTORY — DX: Major depressive disorder, single episode, unspecified: F32.9

## 2018-05-24 HISTORY — DX: Anxiety disorder, unspecified: F41.9

## 2018-05-24 LAB — CBC WITH DIFFERENTIAL/PLATELET
Basophils Absolute: 0 10*3/uL (ref 0.0–0.1)
Basophils Relative: 0 %
Eosinophils Absolute: 0.1 10*3/uL (ref 0.0–0.7)
Eosinophils Relative: 1 %
HEMATOCRIT: 48.8 % (ref 39.0–52.0)
Hemoglobin: 17.6 g/dL — ABNORMAL HIGH (ref 13.0–17.0)
LYMPHS PCT: 17 %
Lymphs Abs: 1.6 10*3/uL (ref 0.7–4.0)
MCH: 30.8 pg (ref 26.0–34.0)
MCHC: 36.1 g/dL — AB (ref 30.0–36.0)
MCV: 85.5 fL (ref 78.0–100.0)
MONOS PCT: 9 %
Monocytes Absolute: 0.8 10*3/uL (ref 0.1–1.0)
NEUTROS ABS: 7.2 10*3/uL (ref 1.7–7.7)
Neutrophils Relative %: 73 %
Platelets: 231 10*3/uL (ref 150–400)
RBC: 5.71 MIL/uL (ref 4.22–5.81)
RDW: 12.5 % (ref 11.5–15.5)
WBC: 9.7 10*3/uL (ref 4.0–10.5)

## 2018-05-24 LAB — HEPATIC FUNCTION PANEL
ALT: 14 U/L (ref 0–44)
AST: 17 U/L (ref 15–41)
Albumin: 4.9 g/dL (ref 3.5–5.0)
Alkaline Phosphatase: 55 U/L (ref 38–126)
Bilirubin, Direct: 0.3 mg/dL — ABNORMAL HIGH (ref 0.0–0.2)
Indirect Bilirubin: 1.6 mg/dL — ABNORMAL HIGH (ref 0.3–0.9)
Total Bilirubin: 1.9 mg/dL — ABNORMAL HIGH (ref 0.3–1.2)
Total Protein: 7.9 g/dL (ref 6.5–8.1)

## 2018-05-24 LAB — BASIC METABOLIC PANEL
ANION GAP: 11 (ref 5–15)
BUN: 17 mg/dL (ref 6–20)
CO2: 30 mmol/L (ref 22–32)
Calcium: 9.8 mg/dL (ref 8.9–10.3)
Chloride: 102 mmol/L (ref 98–111)
Creatinine, Ser: 1.19 mg/dL (ref 0.61–1.24)
GFR calc Af Amer: >60 mL/min (ref >60)
GFR calc non Af Amer: >60 mL/min (ref >60)
Glucose, Bld: 122 mg/dL — ABNORMAL HIGH (ref 70–99)
POTASSIUM: 3.8 mmol/L (ref 3.5–5.1)
Sodium: 143 mmol/L (ref 135–145)

## 2018-05-24 LAB — LIPASE, BLOOD: Lipase: 49 U/L (ref 11–51)

## 2018-05-24 MED ORDER — FAMOTIDINE IN NACL 20-0.9 MG/50ML-% IV SOLN
20.0000 mg | Freq: Once | INTRAVENOUS | Status: AC
  Administered 2018-05-24: 20 mg via INTRAVENOUS
  Filled 2018-05-24: qty 50

## 2018-05-24 MED ORDER — LORAZEPAM 2 MG/ML IJ SOLN
1.0000 mg | Freq: Once | INTRAMUSCULAR | Status: AC
  Administered 2018-05-24: 1 mg via INTRAVENOUS
  Filled 2018-05-24: qty 1

## 2018-05-24 MED ORDER — SODIUM CHLORIDE 0.9 % IV BOLUS
1000.0000 mL | Freq: Once | INTRAVENOUS | Status: AC
  Administered 2018-05-24: 1000 mL via INTRAVENOUS

## 2018-05-24 MED ORDER — ONDANSETRON HCL 4 MG/2ML IJ SOLN
4.0000 mg | Freq: Once | INTRAMUSCULAR | Status: AC
  Administered 2018-05-24: 4 mg via INTRAVENOUS
  Filled 2018-05-24: qty 2

## 2018-05-24 MED ORDER — FAMOTIDINE 20 MG PO TABS: 20.0000 mg | ORAL_TABLET | Freq: Two times a day (BID) | ORAL | 0 refills | Status: DC

## 2018-05-24 MED ORDER — ONDANSETRON 4 MG PO TBDP: ORAL_TABLET | ORAL | 0 refills | Status: AC
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
## 2018-05-24 NOTE — ED Notes (Signed)
Pt has been notified that urine collection is needed  

## 2018-05-24 NOTE — ED Notes (Signed)
Pt given gingerale for PO fluid challenge and encouraged to drink
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
## 2018-05-26 ENCOUNTER — Other Ambulatory Visit: Payer: Self-pay

## 2018-05-26 ENCOUNTER — Emergency Department (HOSPITAL_COMMUNITY)
Admission: EM | Admit: 2018-05-26 | Discharge: 2018-05-26 | Disposition: A | Discharge status: Home / Self Care | Payer: Non-veteran care | Attending: Emergency Medicine | Admitting: Emergency Medicine

## 2018-05-26 ENCOUNTER — Emergency Department (HOSPITAL_COMMUNITY): Payer: Non-veteran care

## 2018-05-26 DIAGNOSIS — R112 Nausea with vomiting, unspecified: Secondary | ICD-10-CM | POA: Insufficient documentation

## 2018-05-26 DIAGNOSIS — F1210 Cannabis abuse, uncomplicated: Secondary | ICD-10-CM | POA: Insufficient documentation

## 2018-05-26 DIAGNOSIS — Z87891 Personal history of nicotine dependence: Secondary | ICD-10-CM | POA: Insufficient documentation

## 2018-05-26 DIAGNOSIS — R109 Unspecified abdominal pain: Secondary | ICD-10-CM | POA: Insufficient documentation

## 2018-05-26 LAB — COMPREHENSIVE METABOLIC PANEL
ALT: 16 U/L (ref 0–44)
AST: 22 U/L (ref 15–41)
Albumin: 4.6 g/dL (ref 3.5–5.0)
Alkaline Phosphatase: 48 U/L (ref 38–126)
Anion gap: 8 (ref 5–15)
BUN: 12 mg/dL (ref 6–20)
CO2: 28 mmol/L (ref 22–32)
Calcium: 9.2 mg/dL (ref 8.9–10.3)
Chloride: 106 mmol/L (ref 98–111)
Creatinine, Ser: 1 mg/dL (ref 0.61–1.24)
GFR calc Af Amer: >60 mL/min (ref >60)
GFR calc non Af Amer: >60 mL/min (ref >60)
Glucose, Bld: 115 mg/dL — ABNORMAL HIGH (ref 70–99)
POTASSIUM: 3.6 mmol/L (ref 3.5–5.1)
Sodium: 142 mmol/L (ref 135–145)
Total Bilirubin: 1.5 mg/dL — ABNORMAL HIGH (ref 0.3–1.2)
Total Protein: 7.2 g/dL (ref 6.5–8.1)

## 2018-05-26 LAB — URINALYSIS, ROUTINE W REFLEX MICROSCOPIC
BILIRUBIN URINE: NEGATIVE
Glucose, UA: NEGATIVE mg/dL
Hgb urine dipstick: NEGATIVE
Ketones, ur: 20 mg/dL — AB
Leukocytes, UA: NEGATIVE
NITRITE: NEGATIVE
Protein, ur: NEGATIVE mg/dL
SPECIFIC GRAVITY, URINE: 1.026 (ref 1.005–1.030)
pH: 5 (ref 5.0–8.0)

## 2018-05-26 LAB — CBC
HEMATOCRIT: 44.3 % (ref 39.0–52.0)
Hemoglobin: 15.9 g/dL (ref 13.0–17.0)
MCH: 30.5 pg (ref 26.0–34.0)
MCHC: 35.9 g/dL (ref 30.0–36.0)
MCV: 84.9 fL (ref 78.0–100.0)
Platelets: 233 10*3/uL (ref 150–400)
RBC: 5.22 MIL/uL (ref 4.22–5.81)
RDW: 12.6 % (ref 11.5–15.5)
WBC: 8 10*3/uL (ref 4.0–10.5)

## 2018-05-26 LAB — LIPASE, BLOOD: LIPASE: 41 U/L (ref 11–51)

## 2018-05-26 MED ORDER — ONDANSETRON HCL 4 MG/2ML IJ SOLN
4.0000 mg | Freq: Once | INTRAMUSCULAR | Status: AC
  Administered 2018-05-26: 4 mg via INTRAVENOUS
  Filled 2018-05-26: qty 2

## 2018-05-26 MED ORDER — SODIUM CHLORIDE 0.9 % IV BOLUS
1000.0000 mL | Freq: Once | INTRAVENOUS | Status: AC
  Administered 2018-05-26: 1000 mL via INTRAVENOUS

## 2018-05-26 MED ORDER — METOCLOPRAMIDE HCL 10 MG PO TABS: 10.0000 mg | ORAL_TABLET | Freq: Four times a day (QID) | ORAL | 0 refills | Status: AC
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
## 2018-05-26 NOTE — ED Notes (Signed)
Pt informed of need for urine.  Will try again in 30 minutes.

## 2018-05-26 NOTE — Discharge Instructions (Addendum)
I have given you information for one of the GI providers where you can follow-up with and get evaluated further if your nausea/vomiting gets worse.  I wish you best of luck with everything!

## 2018-05-26 NOTE — ED Notes (Signed)
Pt aware that we need urine  

## 2018-07-21 ENCOUNTER — Other Ambulatory Visit: Payer: Self-pay

## 2018-07-21 ENCOUNTER — Encounter (HOSPITAL_COMMUNITY): Payer: Self-pay

## 2018-07-21 ENCOUNTER — Emergency Department (HOSPITAL_COMMUNITY)
Admission: EM | Admit: 2018-07-21 | Discharge: 2018-07-21 | Disposition: A | Discharge status: Home / Self Care | Payer: Non-veteran care | Attending: Emergency Medicine | Admitting: Emergency Medicine

## 2018-07-21 DIAGNOSIS — R1013 Epigastric pain: Secondary | ICD-10-CM | POA: Insufficient documentation

## 2018-07-21 DIAGNOSIS — Z87891 Personal history of nicotine dependence: Secondary | ICD-10-CM | POA: Insufficient documentation

## 2018-07-21 DIAGNOSIS — R109 Unspecified abdominal pain: Secondary | ICD-10-CM

## 2018-07-21 DIAGNOSIS — F419 Anxiety disorder, unspecified: Secondary | ICD-10-CM | POA: Insufficient documentation

## 2018-07-21 DIAGNOSIS — F329 Major depressive disorder, single episode, unspecified: Secondary | ICD-10-CM | POA: Insufficient documentation

## 2018-07-21 DIAGNOSIS — R1012 Left upper quadrant pain: Secondary | ICD-10-CM | POA: Insufficient documentation

## 2018-07-21 DIAGNOSIS — Z79899 Other long term (current) drug therapy: Secondary | ICD-10-CM | POA: Insufficient documentation

## 2018-07-21 LAB — CBC
HCT: 48.5 % (ref 39.0–52.0)
Hemoglobin: 17.5 g/dL — ABNORMAL HIGH (ref 13.0–17.0)
MCH: 30.5 pg (ref 26.0–34.0)
MCHC: 36.1 g/dL — ABNORMAL HIGH (ref 30.0–36.0)
MCV: 84.5 fL (ref 78.0–100.0)
PLATELETS: 264 10*3/uL (ref 150–400)
RBC: 5.74 MIL/uL (ref 4.22–5.81)
RDW: 12.5 % (ref 11.5–15.5)
WBC: 9 10*3/uL (ref 4.0–10.5)

## 2018-07-21 LAB — URINALYSIS, ROUTINE W REFLEX MICROSCOPIC
Bilirubin Urine: NEGATIVE
GLUCOSE, UA: NEGATIVE mg/dL
HGB URINE DIPSTICK: NEGATIVE
Ketones, ur: 5 mg/dL — AB
Leukocytes, UA: NEGATIVE
Nitrite: NEGATIVE
Protein, ur: NEGATIVE mg/dL
SPECIFIC GRAVITY, URINE: 1.028 (ref 1.005–1.030)
pH: 5 (ref 5.0–8.0)

## 2018-07-21 LAB — COMPREHENSIVE METABOLIC PANEL
ALBUMIN: 4.9 g/dL (ref 3.5–5.0)
ALK PHOS: 59 U/L (ref 38–126)
ALT: 14 U/L (ref 0–44)
ANION GAP: 11 (ref 5–15)
AST: 18 U/L (ref 15–41)
BILIRUBIN TOTAL: 2.1 mg/dL — AB (ref 0.3–1.2)
BUN: 14 mg/dL (ref 6–20)
CALCIUM: 9.7 mg/dL (ref 8.9–10.3)
CO2: 31 mmol/L (ref 22–32)
Chloride: 103 mmol/L (ref 98–111)
Creatinine, Ser: 1.07 mg/dL (ref 0.61–1.24)
Glucose, Bld: 136 mg/dL — ABNORMAL HIGH (ref 70–99)
POTASSIUM: 3.8 mmol/L (ref 3.5–5.1)
Sodium: 145 mmol/L (ref 135–145)
Total Protein: 8.1 g/dL (ref 6.5–8.1)

## 2018-07-21 LAB — RAPID URINE DRUG SCREEN, HOSP PERFORMED
AMPHETAMINES: NOT DETECTED
BENZODIAZEPINES: NOT DETECTED
Barbiturates: NOT DETECTED
Cocaine: NOT DETECTED
Opiates: NOT DETECTED
Tetrahydrocannabinol: POSITIVE — AB

## 2018-07-21 LAB — LIPASE, BLOOD: Lipase: 55 U/L — ABNORMAL HIGH (ref 11–51)

## 2018-07-21 MED ORDER — HYDROXYZINE HCL 25 MG PO TABS: 25.0000 mg | ORAL_TABLET | Freq: Four times a day (QID) | ORAL | 0 refills | Status: AC

## 2018-07-21 MED ORDER — FAMOTIDINE 20 MG PO TABS: 20.0000 mg | ORAL_TABLET | Freq: Two times a day (BID) | ORAL | 0 refills | Status: AC

## 2018-07-21 MED ORDER — GI COCKTAIL ~~LOC~~
30.0000 mL | Freq: Once | ORAL | Status: AC
  Administered 2018-07-21: 30 mL via ORAL
  Filled 2018-07-21: qty 30

## 2018-07-21 MED ORDER — PROMETHAZINE HCL 25 MG PO TABS
12.5000 mg | ORAL_TABLET | Freq: Once | ORAL | Status: AC
  Administered 2018-07-21: 12.5 mg via ORAL
  Filled 2018-07-21: qty 1

## 2018-07-21 MED ORDER — FAMOTIDINE IN NACL 20-0.9 MG/50ML-% IV SOLN
20.0000 mg | Freq: Once | INTRAVENOUS | Status: AC
  Administered 2018-07-21: 20 mg via INTRAVENOUS
  Filled 2018-07-21: qty 50

## 2018-07-21 MED ORDER — SODIUM CHLORIDE 0.9 % IV BOLUS
1000.0000 mL | Freq: Once | INTRAVENOUS | Status: AC
  Administered 2018-07-21: 1000 mL via INTRAVENOUS

## 2018-07-21 MED ORDER — ONDANSETRON 4 MG PO TBDP
4.0000 mg | ORAL_TABLET | Freq: Once | ORAL | Status: AC | PRN
  Administered 2018-07-21: 4 mg via ORAL
  Filled 2018-07-21: qty 1

## 2018-07-21 MED ORDER — ONDANSETRON HCL 4 MG PO TABS: 4.0000 mg | ORAL_TABLET | Freq: Three times a day (TID) | ORAL | 0 refills | Status: AC | PRN

## 2018-07-21 MED ORDER — SUCRALFATE 1 G PO TABS
1.0000 g | ORAL_TABLET | Freq: Once | ORAL | Status: AC
  Administered 2018-07-21: 1 g via ORAL
  Filled 2018-07-21: qty 1
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
## 2018-07-21 NOTE — ED Notes (Signed)
Patient says he is trying to quit marijuana as his primary coping mechanism for his PTSD, but has limited resources available through the [place] at this time. He is actively asking for assistance and this RN let the PA Cortni know of his need for resources.
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
## 2018-07-21 NOTE — ED Notes (Signed)
Patient is tolerating PO fluids with no N/V

## 2018-07-21 NOTE — Discharge Instructions (Signed)
Please take the medications that you are prescribed as outlined in your discharge paperwork.  Please make sure to stay well-hydrated.  Please follow-up with [facility] as needed.  You are also given resources for outpatient counseling.  You are also given a referral to a gastroenterology doctor if he continued to have persistent symptoms.  Please follow-up with these resources as directed.  Please follow up with your primary doctor within the next 5-7 days.  If you do not have a primary care provider, information for a healthcare clinic has been provided for you to make arrangements for follow up care. Please return to the ER sooner if you have any new or worsening symptoms, or if you have any of the following symptoms:  Abdominal pain that does not go away.  You have a fever.  You keep throwing up (vomiting).  The pain is felt only in portions of the abdomen. Pain in the right side could possibly be appendicitis. In an adult, pain in the left lower portion of the abdomen could be colitis or diverticulitis.  You pass bloody or black tarry stools.  There is bright red blood in the stool.  The constipation stays for more than 4 days.  There is belly (abdominal) or rectal pain.  You do not seem to be getting better.  You have any questions or concerns.

## 2021-02-20 ENCOUNTER — Emergency Department (HOSPITAL_COMMUNITY)
Admission: EM | Admit: 2021-02-20 | Discharge: 2021-02-20 | Disposition: A | Discharge status: Home / Self Care | Payer: No Typology Code available for payment source | Attending: Emergency Medicine | Admitting: Emergency Medicine

## 2021-02-20 ENCOUNTER — Other Ambulatory Visit: Payer: Self-pay

## 2021-02-20 ENCOUNTER — Emergency Department (HOSPITAL_COMMUNITY): Payer: No Typology Code available for payment source

## 2021-02-20 ENCOUNTER — Encounter (HOSPITAL_COMMUNITY): Payer: Self-pay

## 2021-02-20 DIAGNOSIS — M5416 Radiculopathy, lumbar region: Secondary | ICD-10-CM | POA: Insufficient documentation

## 2021-02-20 DIAGNOSIS — Z87891 Personal history of nicotine dependence: Secondary | ICD-10-CM | POA: Insufficient documentation

## 2021-02-20 DIAGNOSIS — M545 Low back pain, unspecified: Secondary | ICD-10-CM | POA: Diagnosis present

## 2021-02-20 DIAGNOSIS — M5410 Radiculopathy, site unspecified: Secondary | ICD-10-CM

## 2021-02-20 DIAGNOSIS — M5440 Lumbago with sciatica, unspecified side: Secondary | ICD-10-CM

## 2021-02-20 MED ORDER — OXYCODONE-ACETAMINOPHEN 5-325 MG PO TABS: 2.0000 | ORAL_TABLET | Freq: Four times a day (QID) | ORAL | 0 refills | Status: AC | PRN

## 2021-02-20 MED ORDER — KETOROLAC TROMETHAMINE 60 MG/2ML IM SOLN
60.0000 mg | Freq: Once | INTRAMUSCULAR | Status: AC
  Administered 2021-02-20: 60 mg via INTRAMUSCULAR
  Filled 2021-02-20: qty 2

## 2021-02-20 MED ORDER — OXYCODONE-ACETAMINOPHEN 5-325 MG PO TABS
1.0000 | ORAL_TABLET | Freq: Four times a day (QID) | ORAL | Status: DC | PRN
  Administered 2021-02-20: 1 via ORAL
  Filled 2021-02-20: qty 1

## 2021-02-20 MED ORDER — CYCLOBENZAPRINE HCL 10 MG PO TABS: 10.0000 mg | ORAL_TABLET | Freq: Two times a day (BID) | ORAL | 0 refills | Status: AC | PRN

## 2021-02-20 NOTE — ED Provider Notes (Signed)
Patient evaluated at bedside by Dr. Johnsie Cancel.  We will discharge with continued use of Medrol Dosepak he was already prescribed.  Will prescribe patient a limited course of Percocet, Flexeril, Tylenol and ibuprofen recommendations given, neurosurgery consultation and follow-up arranged.  Dr. Johnsie Cancel recommend patient follow-up with him outpatient as needed.  Return precautions given.   Solon Augusta [place], [place] 02/20/21 2243    Maia Plan, MD 02/20/21 2358

## 2021-02-20 NOTE — ED Notes (Signed)
[facility] called at this time. Transportation en-route at this time.

## 2021-02-20 NOTE — ED Notes (Signed)
Pt was transferred by [facility] and came from [facility].
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
## 2021-02-20 NOTE — Discharge Instructions (Addendum)
Please keep taking her Medrol/steroid medication. Please follow-up with Dr. Maurice Small.  I prescribed you Flexeril which is a muscle relaxer.  I would like you to also use Tylenol and ibuprofen as discussed below but have also prescribed you a few tablets of a stronger pain medicine called Percocet.  Please only take this as prescribed it is extremely addictive.  Please use Tylenol or ibuprofen for pain.  You may use 600 mg ibuprofen every 6 hours or 1000 mg of Tylenol every 6 hours.  You may choose to alternate between the 2.  This would be most effective.  Not to exceed 4 g of Tylenol within 24 hours.  Not to exceed 3200 mg ibuprofen 24 hours.'
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
## 2021-02-20 NOTE — ED Triage Notes (Addendum)
Pt arrived via POV, c/o left sided back pain that he says is related to sciatica. Coming and going x1 month, worsening today.

## 2021-02-27 ENCOUNTER — Other Ambulatory Visit: Payer: Self-pay

## 2021-02-27 ENCOUNTER — Emergency Department (HOSPITAL_COMMUNITY)
Admission: EM | Admit: 2021-02-27 | Discharge: 2021-02-27 | Disposition: A | Discharge status: Home / Self Care | Payer: No Typology Code available for payment source | Attending: Emergency Medicine | Admitting: Emergency Medicine

## 2021-02-27 DIAGNOSIS — M5442 Lumbago with sciatica, left side: Secondary | ICD-10-CM | POA: Insufficient documentation

## 2021-02-27 DIAGNOSIS — Z87891 Personal history of nicotine dependence: Secondary | ICD-10-CM | POA: Insufficient documentation

## 2021-02-27 DIAGNOSIS — M549 Dorsalgia, unspecified: Secondary | ICD-10-CM | POA: Diagnosis present

## 2021-02-27 MED ORDER — HYDROMORPHONE HCL 1 MG/ML IJ SOLN
1.0000 mg | Freq: Once | INTRAMUSCULAR | Status: AC
  Administered 2021-02-27: 1 mg via INTRAMUSCULAR
  Filled 2021-02-27: qty 1
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
## 2021-02-27 NOTE — ED Triage Notes (Signed)
Pt arrives for eval of sciatic nerve pain from L back down L leg. Here for same last week, was seen by Dr. Maurice Small who was going to operate that day, but pt declined at the time. Went to [place] today who advised him to come back here today because they do not have a neurosurgeon there.

## 2021-02-27 NOTE — ED Notes (Signed)
Reviewed discharge instructions with patient and mother. Follow-up care and medications reviewed. Patient and family verbalized understanding. Patient A&Ox4, VSS, and ambulatory with steady gait upon discharge.

## 2021-02-27 NOTE — Discharge Instructions (Addendum)
Continue taking prednisone, Flexeril, oxycodone, Tylenol, and ibuprofen as prescribed. Follow-up with Dr. Marcy Siren clinic tomorrow for further evaluation of your symptoms. Return to the emergency room if you develop high fevers, loss of bowel bladder control, or any new, worsening, or concerning symptoms.

## 2021-07-10 ENCOUNTER — Other Ambulatory Visit: Payer: Self-pay

## 2021-07-10 ENCOUNTER — Encounter (HOSPITAL_COMMUNITY): Payer: Self-pay

## 2021-07-10 ENCOUNTER — Emergency Department (HOSPITAL_COMMUNITY): Payer: No Typology Code available for payment source

## 2021-07-10 ENCOUNTER — Emergency Department (HOSPITAL_COMMUNITY)
Admission: EM | Admit: 2021-07-10 | Discharge: 2021-07-10 | Disposition: A | Discharge status: Home / Self Care | Payer: No Typology Code available for payment source | Attending: Emergency Medicine | Admitting: Emergency Medicine

## 2021-07-10 DIAGNOSIS — Z87891 Personal history of nicotine dependence: Secondary | ICD-10-CM | POA: Insufficient documentation

## 2021-07-10 DIAGNOSIS — H538 Other visual disturbances: Secondary | ICD-10-CM | POA: Diagnosis not present

## 2021-07-10 LAB — CBC WITH DIFFERENTIAL/PLATELET
Abs Immature Granulocytes: 0.02 10*3/uL (ref 0.00–0.07)
Basophils Absolute: 0 10*3/uL (ref 0.0–0.1)
Basophils Relative: 1 %
Eosinophils Absolute: 0.4 10*3/uL (ref 0.0–0.5)
Eosinophils Relative: 5 %
HCT: 48.8 % (ref 39.0–52.0)
Hemoglobin: 17 g/dL (ref 13.0–17.0)
Immature Granulocytes: 0 %
Lymphocytes Relative: 25 %
Lymphs Abs: 1.8 10*3/uL (ref 0.7–4.0)
MCH: 30 pg (ref 26.0–34.0)
MCHC: 34.8 g/dL (ref 30.0–36.0)
MCV: 86.2 fL (ref 80.0–100.0)
Monocytes Absolute: 0.6 10*3/uL (ref 0.1–1.0)
Monocytes Relative: 8 %
Neutro Abs: 4.4 10*3/uL (ref 1.7–7.7)
Neutrophils Relative %: 61 %
Platelets: 209 10*3/uL (ref 150–400)
RBC: 5.66 MIL/uL (ref 4.22–5.81)
RDW: 12 % (ref 11.5–15.5)
WBC: 7.2 10*3/uL (ref 4.0–10.5)
nRBC: 0 % (ref 0.0–0.2)

## 2021-07-10 LAB — COMPREHENSIVE METABOLIC PANEL
ALT: 13 U/L (ref 0–44)
AST: 18 U/L (ref 15–41)
Albumin: 4.7 g/dL (ref 3.5–5.0)
Alkaline Phosphatase: 50 U/L (ref 38–126)
Anion gap: 9 (ref 5–15)
BUN: 16 mg/dL (ref 6–20)
CO2: 29 mmol/L (ref 22–32)
Calcium: 9.4 mg/dL (ref 8.9–10.3)
Chloride: 104 mmol/L (ref 98–111)
Creatinine, Ser: 0.93 mg/dL (ref 0.61–1.24)
GFR, Estimated: >60 mL/min (ref >60)
Glucose, Bld: 84 mg/dL (ref 70–99)
Potassium: 3.6 mmol/L (ref 3.5–5.1)
Sodium: 142 mmol/L (ref 135–145)
Total Bilirubin: 1.4 mg/dL — ABNORMAL HIGH (ref 0.3–1.2)
Total Protein: 7.4 g/dL (ref 6.5–8.1)

## 2021-07-10 NOTE — Discharge Instructions (Addendum)
As discussed, your evaluation today has been largely reassuring.  But, it is important that you monitor your condition carefully, and do not hesitate to return to the ED if you develop new, or concerning changes in your condition. ? ?Otherwise, please follow-up with your physician for appropriate ongoing care. ? ?
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
## 2021-07-10 NOTE — ED Provider Notes (Signed)
Emergency Medicine Provider Triage Evaluation Note  Paul Porter , a 35 y.o. male  was evaluated in triage.  Pt complains of diploplia x 2 weeks. Its intermittent, worsens throughout the day. Also has right numbness/tingling to 5th and 5th digit on ventral side x 4 months. No bilteraly extrity weakness, no family history of MS. Recent back surgery roughly 4 months ago.   Review of Systems  Positive: Tingling, diplopia  Negative: Head trauma, headache  Physical Exam  BP (!) 150/116 (BP Location: Left Arm)   Pulse 95   Temp 98 F (36.7 C) (Oral)   Resp 16   SpO2 98%  Gen:   Awake, no distress   Resp:  Normal effort  MSK:   Moves extremities without difficulty  Other:  EOMI. Pupils PERRLA.  Medical Decision Making  Medically screening exam initiated at 2:02 PM.  Appropriate orders placed.  Keldric Poyer Broxton was informed that the remainder of the evaluation will be completed by another provider, this initial triage assessment does not replace that evaluation, and the importance of remaining in the ED until their evaluation is complete.     Theron Arista, PA-C 07/10/21 1404    Ernie Avena, MD 07/10/21 [phone]

## 2021-07-10 NOTE — ED Triage Notes (Signed)
Pt arrived via POV, c/o eye issues x2 weeks on and off. States in and out focusing bilaterally.

## 2021-07-11 ENCOUNTER — Encounter: Payer: Self-pay | Admitting: Neurology
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
## 2021-07-26 ENCOUNTER — Ambulatory Visit: Payer: No Typology Code available for payment source | Admitting: Neurology

## 2021-09-20 ENCOUNTER — Ambulatory Visit: Payer: No Typology Code available for payment source | Admitting: Neurology

## 2023-03-27 IMAGING — CT CT HEAD W/O CM
3 series · 15 of 47 positions shown, 18 images · non-contrast
Comparison: MRI brain from 09/15/2014

CLINICAL DATA: Evaluate for stroke. New onset visual disturbance
for 2 weeks.

EXAM:
CT HEAD WITHOUT CONTRAST
TECHNIQUE: Contiguous axial images were obtained from the base of the skull
through the vertex without intravenous contrast.

[Series 3: head wo · axial · 0.47mm/px · z∈[+1430,+1560]mm · 9 of 32 slices shown, 12 images]
[im 3/32  brain]
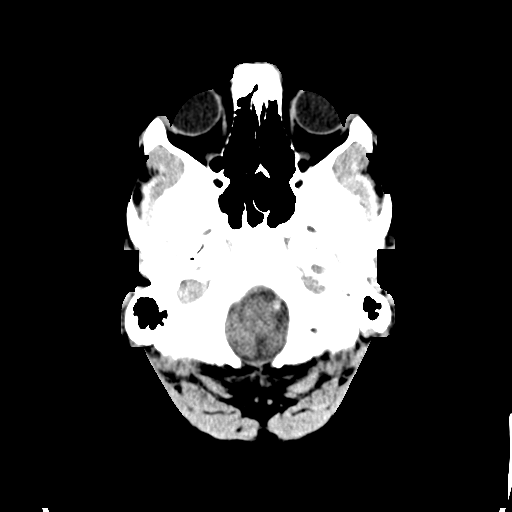
[im 3/32  bone]
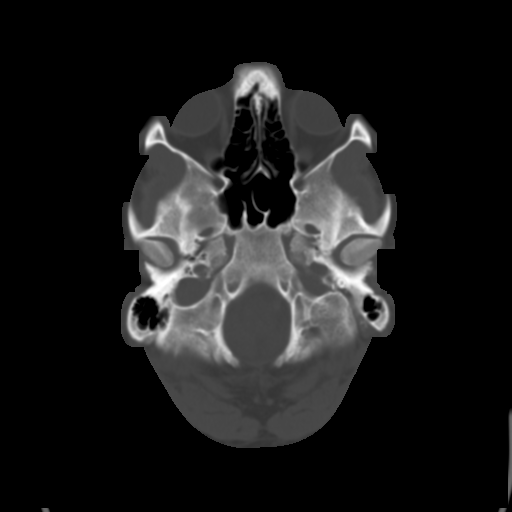
[im 6/32  brain]
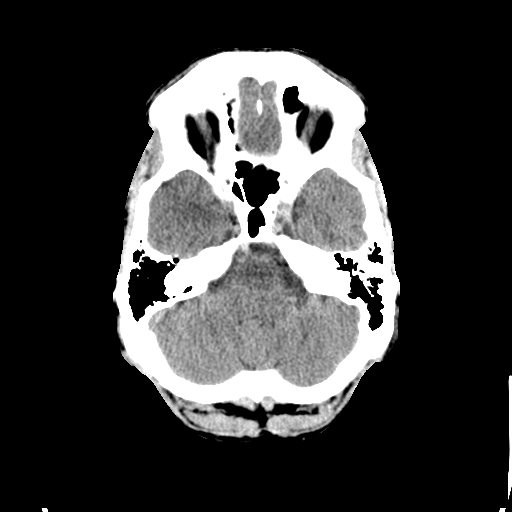
[im 9/32  brain]
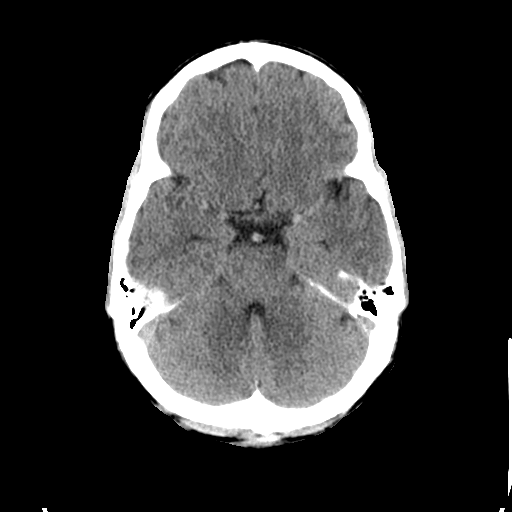
[im 12/32  brain]
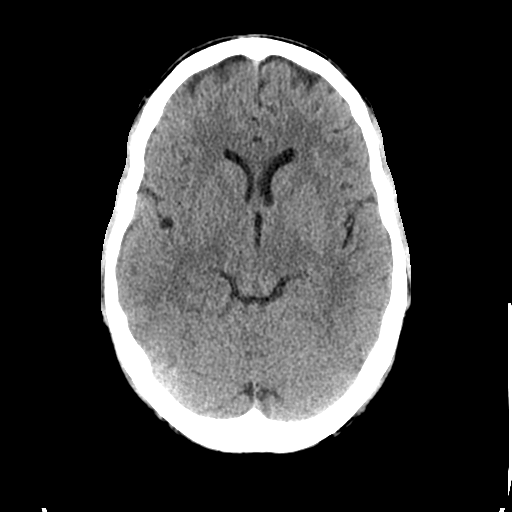
[im 17/32  brain]
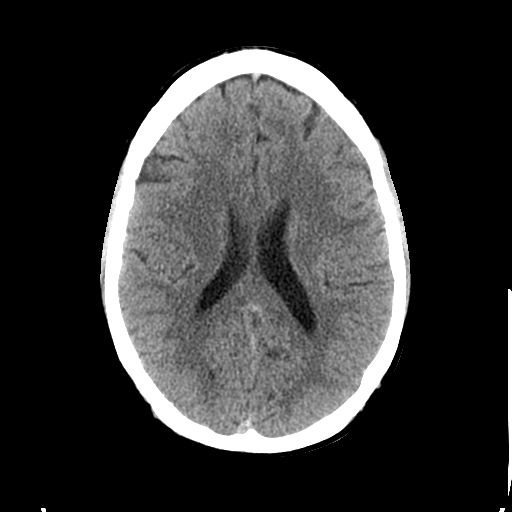
[im 17/32  bone]
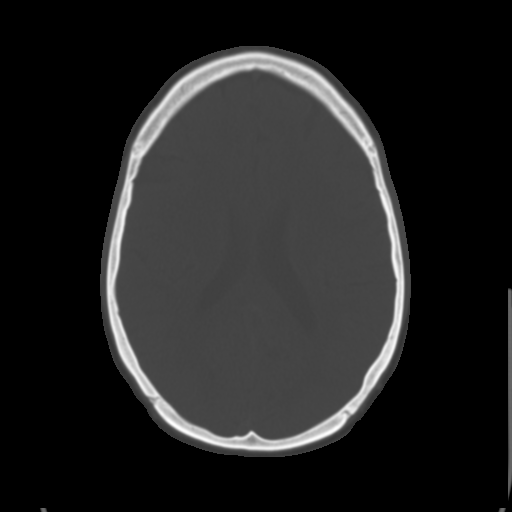
[im 20/32  brain]
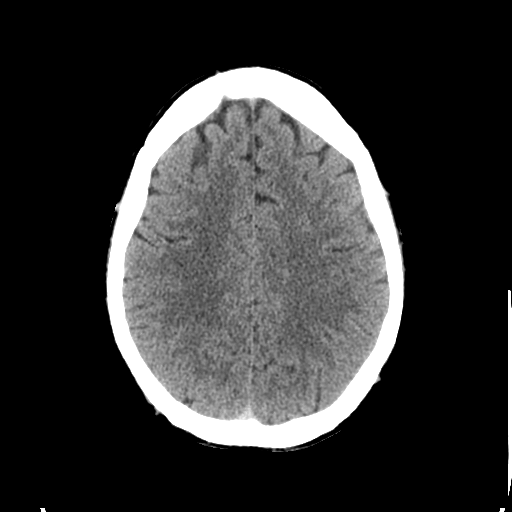
[im 23/32  brain]
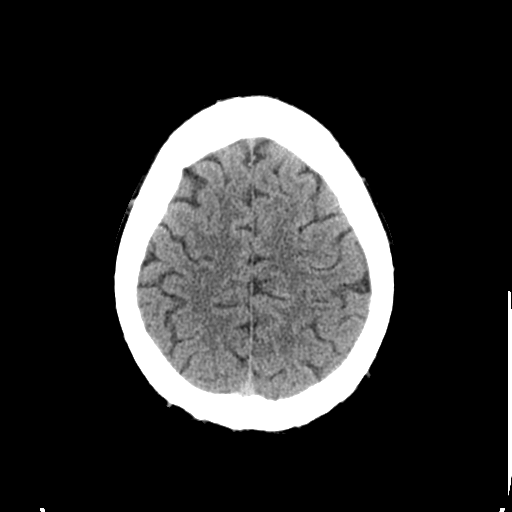
[im 26/32  brain]
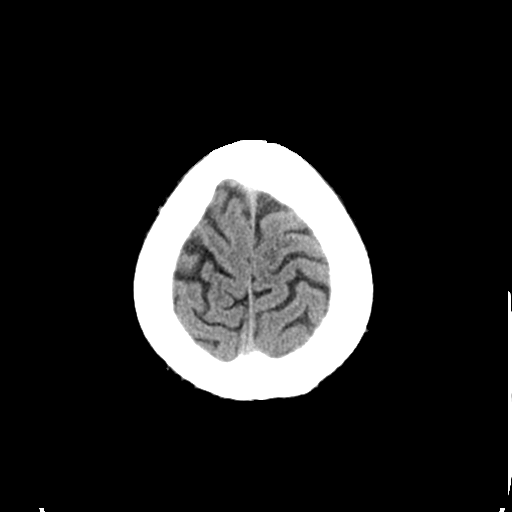
[im 29/32  brain]
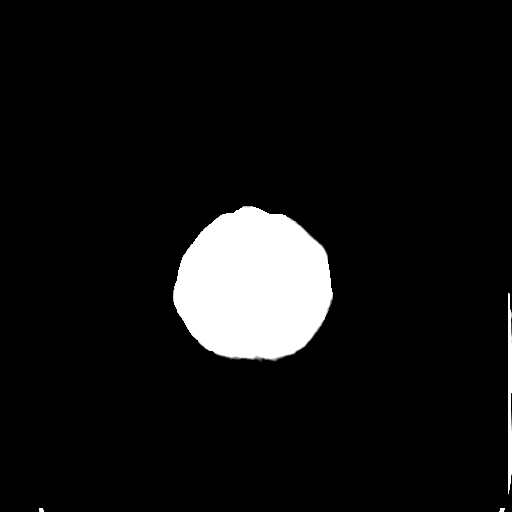
[im 29/32  bone]
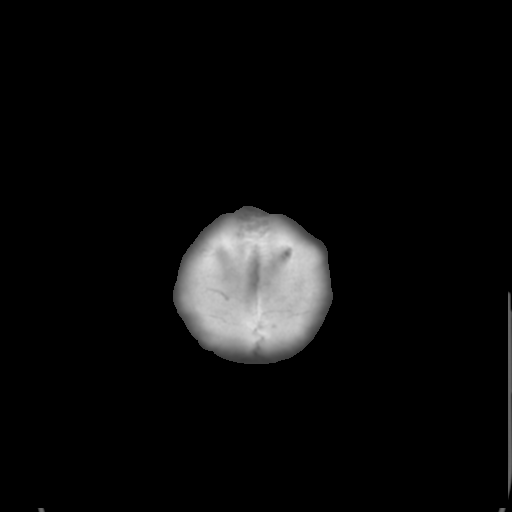

[Series 5: coronal soft tissue · coronal · 0.33mm/px · 3 of 66 slices shown]
[im 22/66  brain]
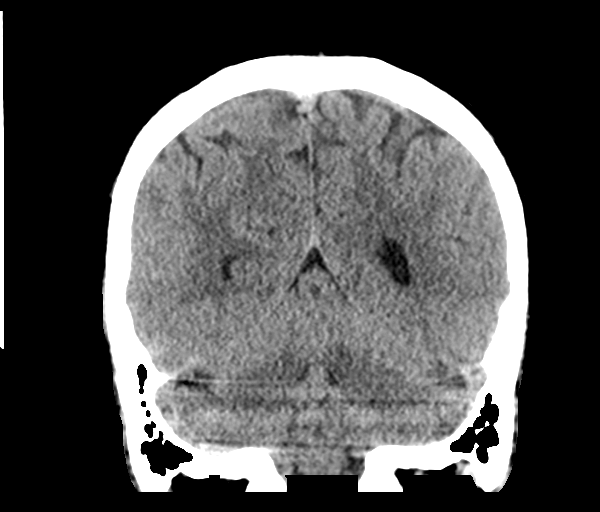
[im 29/66  brain]
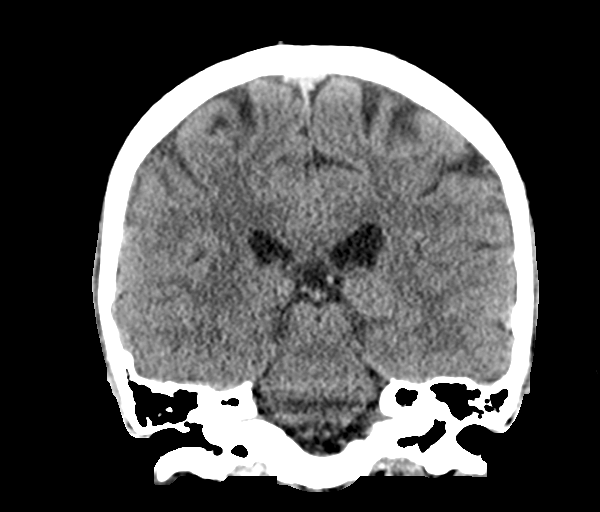
[im 37/66  brain]
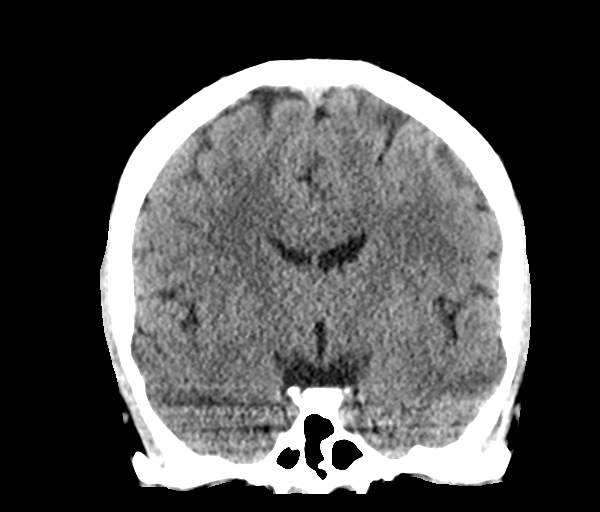

[Series 6: sagittal soft tissue · sagittal · 0.33mm/px · 3 of 54 slices shown]
[im 18/54  brain]
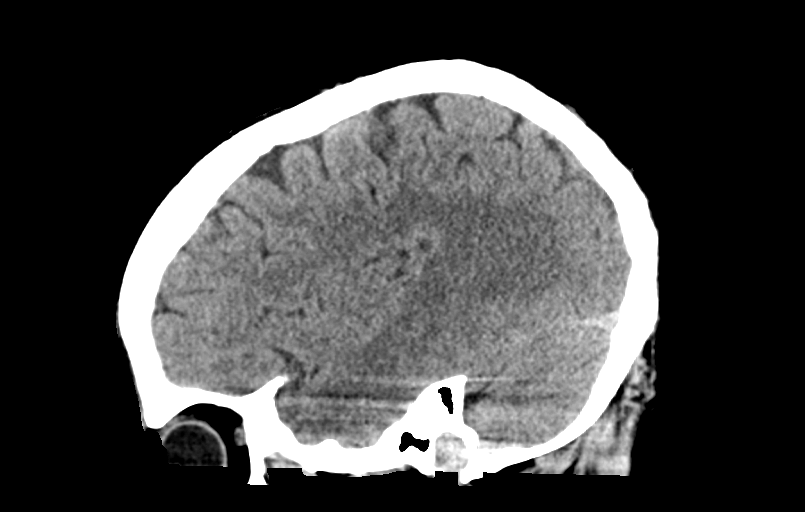
[im 27/54  brain]
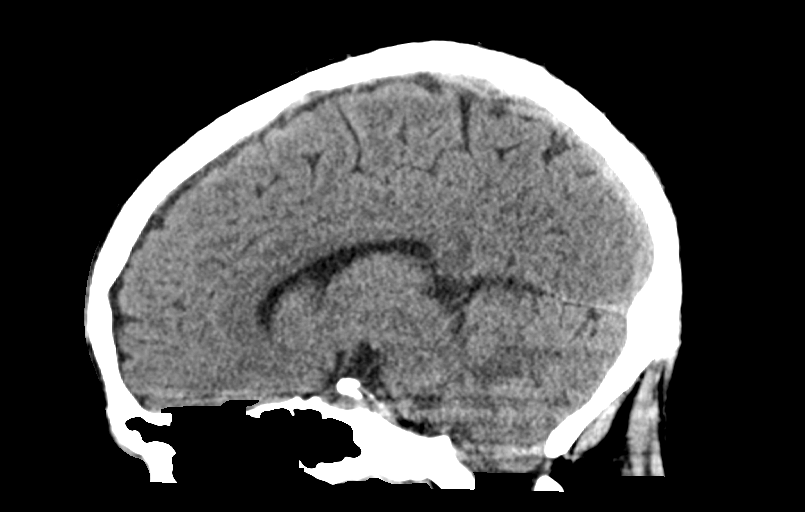
[im 36/54  brain]
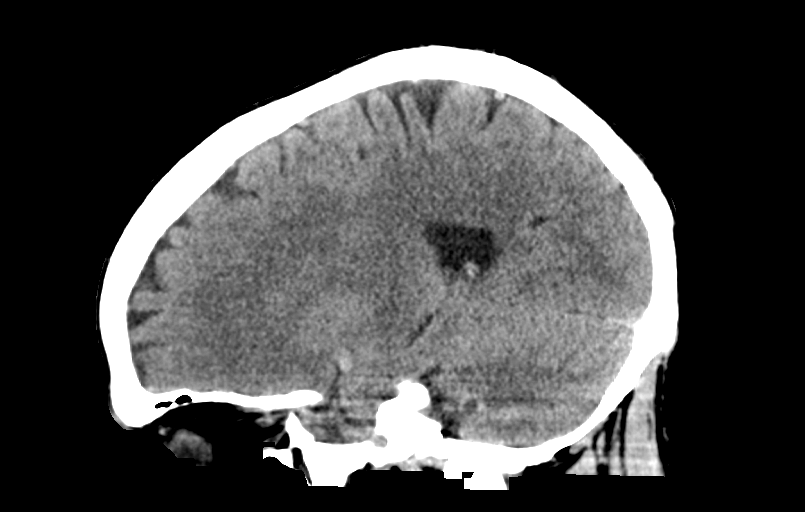

[15 of 47 positions shown; findings below may reference images not displayed]

FINDINGS: Brain: No evidence of acute infarction, hemorrhage, hydrocephalus,
extra-axial collection or mass lesion/mass effect.

Vascular: No hyperdense vessel or unexpected calcification.

Skull: Normal. Negative for fracture or focal lesion.

Sinuses/Orbits: No acute finding.

Other: None
IMPRESSION: No acute intracranial abnormalities. Normal brain.
